# Patient Record
Sex: Male | Born: 1960 | Race: White | Hispanic: No | Marital: Single | Smoking: Never smoker
Health system: Southern US, Community
[De-identification: ages and names within clinical notes are randomized; demographics above are authoritative.]

## PROBLEM LIST (undated history)

## (undated) DIAGNOSIS — L570 Actinic keratosis: Secondary | ICD-10-CM

## (undated) DIAGNOSIS — G4730 Sleep apnea, unspecified: Secondary | ICD-10-CM

## (undated) HISTORY — DX: Actinic keratosis: L57.0

---

## 2006-06-30 ENCOUNTER — Ambulatory Visit: Payer: Self-pay | Admitting: Internal Medicine

## 2009-08-06 ENCOUNTER — Emergency Department: Payer: Self-pay | Admitting: Emergency Medicine

## 2013-09-16 ENCOUNTER — Ambulatory Visit: Payer: Self-pay | Admitting: Gastroenterology

## 2015-07-23 DIAGNOSIS — C4491 Basal cell carcinoma of skin, unspecified: Secondary | ICD-10-CM

## 2015-07-23 HISTORY — DX: Basal cell carcinoma of skin, unspecified: C44.91

## 2017-02-26 DIAGNOSIS — E1169 Type 2 diabetes mellitus with other specified complication: Secondary | ICD-10-CM | POA: Insufficient documentation

## 2017-02-26 DIAGNOSIS — E118 Type 2 diabetes mellitus with unspecified complications: Secondary | ICD-10-CM | POA: Insufficient documentation

## 2019-11-22 ENCOUNTER — Ambulatory Visit: Payer: PRIVATE HEALTH INSURANCE | Admitting: Dermatology

## 2019-11-22 ENCOUNTER — Other Ambulatory Visit: Payer: Self-pay

## 2019-11-22 DIAGNOSIS — L738 Other specified follicular disorders: Secondary | ICD-10-CM

## 2019-11-22 DIAGNOSIS — L578 Other skin changes due to chronic exposure to nonionizing radiation: Secondary | ICD-10-CM

## 2019-11-22 DIAGNOSIS — L570 Actinic keratosis: Secondary | ICD-10-CM | POA: Diagnosis not present

## 2019-11-22 DIAGNOSIS — L821 Other seborrheic keratosis: Secondary | ICD-10-CM

## 2019-11-22 DIAGNOSIS — Z85828 Personal history of other malignant neoplasm of skin: Secondary | ICD-10-CM | POA: Diagnosis not present

## 2019-11-22 NOTE — Progress Notes (Signed)
   Follow-Up Visit   Subjective  Michael Murillo is a 59 y.o. male who presents for the following: Follow-up (Hx of AKs on scalp, bil temples, R forearm.) and Scaly spots (arms).   The following portions of the chart were reviewed this encounter and updated as appropriate:     Review of Systems: No other skin or systemic complaints.  Objective  Well appearing patient in no apparent distress; mood and affect are within normal limits.  A focused examination was performed including face, arms, scalp. Relevant physical exam findings are noted in the Assessment and Plan.  Objective  Left Forearm x 4, Left Hand x 2, Right Hand x 3, Right Forearm x 1, Left Forehead x 2, Crown Scalp x 6, Left Temple x 1 (19): Pink brown scaly macules.  Objective  Left Forearm: Well healed scar with no evidence of recurrence.   Objective  Left Temple Scalp: Stuck-on, waxy brown plaque --Discussed benign etiology and prognosis.   Assessment & Plan   Actinic Damage - diffuse scaly erythematous macules with underlying dyspigmentation - Recommend daily broad spectrum sunscreen SPF 30+ to sun-exposed areas, reapply every 2 hours as needed.  - Call for new or changing lesions. Seborrheic Keratoses - Stuck-on, waxy, tan-brown papules and plaques  - Discussed benign etiology and prognosis. - Observe - Call for any changes Sebaceous Hyperplasia - Small yellow papules with a central dell - Benign - Observe          AK (actinic keratosis) (19) Left Forearm x 4, Left Hand x 2, Right Hand x 3, Right Forearm x 1, Left Forehead x 2, Crown Scalp x 6, Left Temple x 1  Destruction of lesion - Left Forearm x 4, Left Hand x 2, Right Hand x 3, Right Forearm x 1, Left Forehead x 2, Crown Scalp x 6, Left Temple x 1  Destruction method: cryotherapy   Informed consent: discussed and consent obtained   Lesion destroyed using liquid nitrogen: Yes   Region frozen until ice ball extended beyond lesion: Yes     Outcome: patient tolerated procedure well with no complications   Post-procedure details: wound care instructions given    History of basal cell carcinoma (BCC) Left Forearm  Clear. Observe for recurrence. Call clinic for new or changing lesions.  Recommend regular skin exams, daily broad-spectrum spf 30+ sunscreen use, and photoprotection.     Seborrheic keratosis Left Temple Scalp  Benign, observe.  Discussed LN2 if becomes bothersome, will take multiple treatments to clear.  Return in about 6 months (around 05/23/2020) for UBSE.   IJamesetta Orleans, CMA, am acting as scribe for Brendolyn Patty, MD .

## 2019-11-22 NOTE — Patient Instructions (Signed)
Cryotherapy Aftercare  . Wash gently with soap and water everyday.   . Apply Vaseline and Band-Aid daily until healed.  

## 2020-01-18 ENCOUNTER — Encounter: Payer: Self-pay | Admitting: Podiatry

## 2020-01-18 ENCOUNTER — Other Ambulatory Visit: Payer: Self-pay | Admitting: Podiatry

## 2020-01-18 ENCOUNTER — Ambulatory Visit: Payer: PRIVATE HEALTH INSURANCE | Admitting: Podiatry

## 2020-01-18 ENCOUNTER — Other Ambulatory Visit: Payer: Self-pay

## 2020-01-18 ENCOUNTER — Ambulatory Visit (INDEPENDENT_AMBULATORY_CARE_PROVIDER_SITE_OTHER): Payer: PRIVATE HEALTH INSURANCE

## 2020-01-18 DIAGNOSIS — M2022 Hallux rigidus, left foot: Secondary | ICD-10-CM | POA: Diagnosis not present

## 2020-01-18 DIAGNOSIS — M722 Plantar fascial fibromatosis: Secondary | ICD-10-CM

## 2020-01-18 DIAGNOSIS — L603 Nail dystrophy: Secondary | ICD-10-CM | POA: Diagnosis not present

## 2020-01-18 DIAGNOSIS — S86012A Strain of left Achilles tendon, initial encounter: Secondary | ICD-10-CM

## 2020-01-18 MED ORDER — MELOXICAM 15 MG PO TABS: 15.0000 mg | ORAL_TABLET | Freq: Every day | ORAL | 3 refills | Status: DC

## 2020-01-18 MED ORDER — TERBINAFINE HCL 250 MG PO TABS: 250.0000 mg | ORAL_TABLET | Freq: Every day | ORAL | 0 refills | Status: DC
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
## 2020-01-19 ENCOUNTER — Telehealth: Payer: Self-pay

## 2020-01-19 NOTE — Telephone Encounter (Signed)
Office notes have been faxed to Marissa Calamity with [employer] for review.  Fax [phone]

## 2020-01-20 ENCOUNTER — Telehealth: Payer: Self-pay

## 2020-01-20 DIAGNOSIS — S86012A Strain of left Achilles tendon, initial encounter: Secondary | ICD-10-CM

## 2020-01-20 NOTE — Telephone Encounter (Signed)
MRI approved from 01/19/20 to 04/18/20 Auth # [facility] Patient has been notified of approval and will contact scheduling to set up appt to his convenience.

## 2020-01-23 ENCOUNTER — Ambulatory Visit: Payer: Self-pay | Admitting: Podiatry

## 2020-02-05 ENCOUNTER — Ambulatory Visit
Admission: RE | Admit: 2020-02-05 | Discharge: 2020-02-05 | Disposition: A | Discharge status: Home / Self Care | Payer: PRIVATE HEALTH INSURANCE | Source: Ambulatory Visit | Attending: Podiatry | Admitting: Podiatry

## 2020-02-05 ENCOUNTER — Other Ambulatory Visit: Payer: Self-pay

## 2020-02-05 DIAGNOSIS — S86012A Strain of left Achilles tendon, initial encounter: Secondary | ICD-10-CM | POA: Insufficient documentation

## 2020-02-07 ENCOUNTER — Telehealth: Payer: Self-pay | Admitting: *Deleted

## 2020-02-07 NOTE — Telephone Encounter (Signed)
I informed pt of Dr. Stephenie Acres request to send a copy of the MRI disc to a radiology specialist for more details for treatment planning and there would be a 10 -14 day delay in final results and once received we would call with instructions. Faxed request for MRI disc to [facility].

## 2020-02-07 NOTE — Telephone Encounter (Signed)
-----   Message from Garrel Ridgel, [place] sent at 02/07/2020  7:40 AM EDT ----- Val please send for over read and inform patient of the delay.  I highly suspect an interstitial tear and would like another opinion.  Thanks

## 2020-02-16 NOTE — Telephone Encounter (Signed)
Received copy of MRI disc and mailed to [facility]. 

## 2020-03-12 ENCOUNTER — Encounter: Payer: Self-pay | Admitting: Podiatry

## 2020-03-12 ENCOUNTER — Other Ambulatory Visit: Payer: Self-pay

## 2020-03-12 ENCOUNTER — Ambulatory Visit: Payer: PRIVATE HEALTH INSURANCE | Admitting: Podiatry

## 2020-03-12 DIAGNOSIS — S86012D Strain of left Achilles tendon, subsequent encounter: Secondary | ICD-10-CM | POA: Diagnosis not present

## 2020-03-12 NOTE — Progress Notes (Signed)
He presents today for MRI results regarding his left heel.  He denies any changes in his past medical history medications allergies surgeries and social history.  He states he continues to take his Lamisil without any problems.  Continues to take the Mobic as needed for pain.  Objective: Vital signs are stable he is alert oriented x3 MRI does demonstrate partial thickness interstitial tearing at the Achilles insertion site.  Retrocalcaneal bursitis is also noted.  Assessment: Tears of the Achilles tendinous insertion site.  Onychomycosis.  Plan: Continue current medications.  We did discuss in great detail today what he would take to surgically repair this he understands and is amenable to it he would like to follow-up with his family and discuss this and possibly a second opinion.

## 2020-03-19 ENCOUNTER — Telehealth: Payer: Self-pay | Admitting: *Deleted

## 2020-03-19 ENCOUNTER — Telehealth: Payer: Self-pay

## 2020-03-19 NOTE — Telephone Encounter (Signed)
Patient called today and stated that he would like to go ahead with surgery.  Do you want him to come back in for office visit or can he come by to pick up surgical packet and sign forms since surgery has already been discussed.   Please advise

## 2020-03-19 NOTE — Telephone Encounter (Signed)
"  I was calling to schedule surgery on my Achilles Tendon.  I been seeing Dr. Milinda Pointer.  Do I need to talk back with him or you all? Give me a call."

## 2020-03-20 NOTE — Telephone Encounter (Signed)
No he must come back because the note was not written as a consult and he was considering another opinion.

## 2020-03-20 NOTE — Telephone Encounter (Signed)
I spoke with patient and informed him that he would need to come back in for surgical consult with Dr. Milinda Pointer.  He stated that he will call back to get scheduled

## 2020-04-11 ENCOUNTER — Ambulatory Visit: Payer: PRIVATE HEALTH INSURANCE | Admitting: Podiatry

## 2020-04-11 ENCOUNTER — Encounter: Payer: Self-pay | Admitting: Podiatry

## 2020-04-11 ENCOUNTER — Other Ambulatory Visit: Payer: Self-pay

## 2020-04-11 DIAGNOSIS — S86012D Strain of left Achilles tendon, subsequent encounter: Secondary | ICD-10-CM | POA: Diagnosis not present

## 2020-04-11 NOTE — Progress Notes (Signed)
He presents today to discuss surgery on his left foot.  We had discussed it previously as her MRI come back positive for a tear of the Achilles tendon.  Objective: Vital signs are stable alert oriented x3.  Pulses are palpable.  He still has severe pain on palpation of the posterior aspect of the calcaneus he also has pain on palpation of the trunk and attempted range of motion of the first metatarsophalangeal joint.  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  Plan: Discussed etiology pathology conservative versus surgical therapies.  At this point we consented him today for a gastroc recession Achilles tenolysis retrocalcaneal heel spur resection a Keller arthroplasty with a single silicone implant and a cast.  Answered all questions regarding these procedures to the best of my ability layman's terms he understands that he will be out of work for no less than at least 4 weeks usually 2 months with this.  We did discuss the possible side effects and complications which may include but not limited to postop pain bleeding swelling infection recurrence need for further surgery overcorrection under correction loss of digit loss of limb loss of life.  I will follow-up with him in the near future for surgical intervention should he have questions or concerns he will notify us immediately.

## 2020-04-25 ENCOUNTER — Telehealth: Payer: Self-pay | Admitting: *Deleted

## 2020-04-25 NOTE — Telephone Encounter (Signed)
I'm calling to let you know we have not received any FMLA forms from [facility].  The only thing we have is the form that you brought by giving [place] permission to release your information.  "They should have sent you something by now.  I'll give them a call and follow-up."  Great, I just wanted to give you a heads up because I know you're scheduled for surgery soon.  "Thank you so much, I appreciate it."

## 2020-04-26 ENCOUNTER — Telehealth: Payer: Self-pay

## 2020-04-26 NOTE — Telephone Encounter (Signed)
DOS 09/24/021  KELLER BUNION IMPLANT LT - [id number] TENOLYSIS LT - [id number] GASTROCNEMIUS RECESS LT - [id number] CALCANEAL OSTECTOMY LT - 24097  [employer] EFFECTIVE DATE - 08/11/2014  PLAN DEDUCTIBLE - $2000.00 W/ $2000.00 MET OUT OF POCKET - $4500.00 W/ $2503.77 MET COPAY $0.00 COINSURANCE - 80%  SPOKE TO [employer] FOR BENEFITS CALL REF # [id number]  SPOKE TO ROBIN A AT [employer] FOR AUTHORIZATION CALL REF # [id number] A 04/26/2020. ROBIN STATED NO AUTH REQUIRED FOR CPT [id number], [id number] OR [id number].

## 2020-05-03 ENCOUNTER — Other Ambulatory Visit: Payer: Self-pay | Admitting: Podiatry

## 2020-05-03 MED ORDER — OXYCODONE-ACETAMINOPHEN 10-325 MG PO TABS: 1.0000 | ORAL_TABLET | Freq: Three times a day (TID) | ORAL | 0 refills | Status: AC | PRN

## 2020-05-03 MED ORDER — ONDANSETRON HCL 4 MG PO TABS: 4.0000 mg | ORAL_TABLET | Freq: Three times a day (TID) | ORAL | 0 refills | Status: DC | PRN

## 2020-05-03 MED ORDER — CEPHALEXIN 500 MG PO CAPS
500.0000 mg | ORAL_CAPSULE | Freq: Three times a day (TID) | ORAL | 0 refills | Status: DC
Start: 2020-05-03 — End: 2020-06-18

## 2020-05-04 DIAGNOSIS — M216X2 Other acquired deformities of left foot: Secondary | ICD-10-CM | POA: Diagnosis not present

## 2020-05-04 DIAGNOSIS — M7732 Calcaneal spur, left foot: Secondary | ICD-10-CM

## 2020-05-04 DIAGNOSIS — M2022 Hallux rigidus, left foot: Secondary | ICD-10-CM | POA: Diagnosis not present

## 2020-05-04 DIAGNOSIS — S86012D Strain of left Achilles tendon, subsequent encounter: Secondary | ICD-10-CM | POA: Diagnosis not present

## 2020-05-04 NOTE — Telephone Encounter (Signed)
An appointment was scheduled for 04/11/2020 with Dr. Milinda Pointer.

## 2020-05-09 ENCOUNTER — Other Ambulatory Visit: Payer: Self-pay

## 2020-05-09 ENCOUNTER — Ambulatory Visit (INDEPENDENT_AMBULATORY_CARE_PROVIDER_SITE_OTHER): Payer: PRIVATE HEALTH INSURANCE

## 2020-05-09 ENCOUNTER — Ambulatory Visit (INDEPENDENT_AMBULATORY_CARE_PROVIDER_SITE_OTHER): Payer: PRIVATE HEALTH INSURANCE | Admitting: Podiatry

## 2020-05-09 ENCOUNTER — Encounter: Payer: Self-pay | Admitting: Podiatry

## 2020-05-09 VITALS — BP 143/101 | HR 84 | Temp 99.1°F

## 2020-05-09 DIAGNOSIS — S86012D Strain of left Achilles tendon, subsequent encounter: Secondary | ICD-10-CM | POA: Diagnosis not present

## 2020-05-09 DIAGNOSIS — Z9889 Other specified postprocedural states: Secondary | ICD-10-CM

## 2020-05-09 NOTE — Progress Notes (Signed)
He presents today for his first postop visit date of surgery 05/04/2020 retrocalcaneal heel spur resection gastroc recession Achilles tenolysis and arthroplasty of the first metatarsophalangeal joint.  He denies fever chills nausea vomiting muscle aches pains calf pain back pain chest pain shortness of breath.  Objective: Cast is intact appears to be clean on the bottom using a wheelchair at this point he has good motion of the toe.  Radiographs demonstrate a Keller arthroplasty single silicone implant in good position as well as a retrocalcaneal heel spur resection in good position.  Assessment: Well-healing surgical foot.  Plan: Have him back in 1 week for cast exchange.  Follow-up with me in 1 week call sooner if needed.

## 2020-05-14 ENCOUNTER — Other Ambulatory Visit: Payer: Self-pay | Admitting: Podiatry

## 2020-05-16 ENCOUNTER — Ambulatory Visit (INDEPENDENT_AMBULATORY_CARE_PROVIDER_SITE_OTHER): Payer: PRIVATE HEALTH INSURANCE | Admitting: Podiatry

## 2020-05-16 ENCOUNTER — Other Ambulatory Visit: Payer: Self-pay

## 2020-05-16 DIAGNOSIS — S86012D Strain of left Achilles tendon, subsequent encounter: Secondary | ICD-10-CM | POA: Diagnosis not present

## 2020-05-16 DIAGNOSIS — Z9889 Other specified postprocedural states: Secondary | ICD-10-CM

## 2020-05-16 NOTE — Progress Notes (Signed)
He presents today for his second postop visit he is status post gastroc recession retrocalcaneal heel spur resection Achilles tenolysis and cast application left foot.  He denies fever chills nausea vomiting muscle aches pains calf pain back pain chest pain shortness of breath.  Presents today with cast intact dry and clean.  Ejective: Vital signs are stable alert oriented x3 presents nonweightbearing utilizing a knee scooter cast is dry and clean.  I once removed demonstrates dressing intact was removed demonstrates sutures and staples are intact there is no edema minimal erythema no cellulitis drainage or odor.  He has great range of motion of the first metatarsophalangeal joint actively and passively.  He has good plantar flexion against resistance.    Assessment: Well-healing surgical foot leg left x2 weeks.  Plan: Redressed today with dressed a compressive dressing and placed him in another below-the-knee cast he will continue nonweightbearing status for 2 weeks the cast with him to be removed and he will plate placed in a cam walker after all the staples are removed if possible.  He will be nonweightbearing for another 2 weeks at that point

## 2020-05-30 ENCOUNTER — Encounter: Payer: Self-pay | Admitting: Podiatry

## 2020-05-30 ENCOUNTER — Other Ambulatory Visit: Payer: Self-pay

## 2020-05-30 ENCOUNTER — Ambulatory Visit (INDEPENDENT_AMBULATORY_CARE_PROVIDER_SITE_OTHER): Payer: PRIVATE HEALTH INSURANCE | Admitting: Podiatry

## 2020-05-30 DIAGNOSIS — S86012D Strain of left Achilles tendon, subsequent encounter: Secondary | ICD-10-CM | POA: Diagnosis not present

## 2020-05-30 DIAGNOSIS — Z9889 Other specified postprocedural states: Secondary | ICD-10-CM | POA: Diagnosis not present

## 2020-05-30 NOTE — Progress Notes (Signed)
  Subjective:  Patient ID: Michael Murillo, male    DOB: Dec 06, 1960,  MRN: [id number]  Chief Complaint  Patient presents with  . Routine Post Op    POV #3 DOS 05/04/20 ACHILLES TENOLYSIS, HEEL SPUR RECESTION, GASTRO RECESSION, KELLER ARTHROPLASTY W/SILICONE IMPLANT AND CAST APPLICATION LT FOOT  . Suture / Staple Removal    Staples removed today    DOS: 05/04/2020 Procedure: Left Achilles heel spur resection, Achilles debridement, gastrocnemius recession, and Keller arthroplasty with silicone implant  59 y.o. male returns for post-op check.  Doing well.  He has been NWB in a BK cast  Review of Systems: Negative except as noted in the HPI. Denies N/V/F/Ch.   Objective:  There were no vitals filed for this visit. There is no height or weight on file to calculate BMI. Constitutional Well developed. Well nourished.  Vascular Foot warm and well perfused. Capillary refill normal to all digits.   Neurologic Normal speech. Oriented to person, place, and time. Epicritic sensation to light touch grossly present bilaterally.  Dermatologic Skin healed well without signs of infection.  Non- hypertrophic scar.  Skin edges well coapted without signs of infection.  Orthopedic: Tenderness to palpation noted about the surgical site.     Assessment:   1. Rupture of left Achilles tendon, subsequent encounter   2. S/P foot surgery, left    Plan:  Patient was evaluated and treated and all questions answered.  S/p foot surgery left -Progressing as expected post-operatively. -WB Status: NWB in CAM boot with crutches -Sutures: All removed today as well as staples.  Fresh Steri-Strips were applied. -Begin range of motion exercises of the hallux -Return to see Dr. Milinda Pointer in 2 weeks for reevaluation, hopeful transition to partial weightbearing in CAM boot -Advised he may gently bathe his incisions, but should not soak them  Return in about 2 weeks (around 06/13/2020).

## 2020-06-04 ENCOUNTER — Encounter: Payer: PRIVATE HEALTH INSURANCE | Admitting: Podiatry

## 2020-06-05 ENCOUNTER — Ambulatory Visit: Payer: PRIVATE HEALTH INSURANCE | Admitting: Dermatology

## 2020-06-05 ENCOUNTER — Other Ambulatory Visit: Payer: Self-pay

## 2020-06-05 DIAGNOSIS — L814 Other melanin hyperpigmentation: Secondary | ICD-10-CM

## 2020-06-05 DIAGNOSIS — L578 Other skin changes due to chronic exposure to nonionizing radiation: Secondary | ICD-10-CM

## 2020-06-05 DIAGNOSIS — B351 Tinea unguium: Secondary | ICD-10-CM | POA: Diagnosis not present

## 2020-06-05 DIAGNOSIS — Z1283 Encounter for screening for malignant neoplasm of skin: Secondary | ICD-10-CM

## 2020-06-05 DIAGNOSIS — L821 Other seborrheic keratosis: Secondary | ICD-10-CM

## 2020-06-05 DIAGNOSIS — L739 Follicular disorder, unspecified: Secondary | ICD-10-CM | POA: Diagnosis not present

## 2020-06-05 DIAGNOSIS — L570 Actinic keratosis: Secondary | ICD-10-CM | POA: Diagnosis not present

## 2020-06-05 DIAGNOSIS — Z85828 Personal history of other malignant neoplasm of skin: Secondary | ICD-10-CM

## 2020-06-05 DIAGNOSIS — L918 Other hypertrophic disorders of the skin: Secondary | ICD-10-CM

## 2020-06-05 DIAGNOSIS — D229 Melanocytic nevi, unspecified: Secondary | ICD-10-CM

## 2020-06-05 DIAGNOSIS — D2339 Other benign neoplasm of skin of other parts of face: Secondary | ICD-10-CM

## 2020-06-05 DIAGNOSIS — D1800 Hemangioma unspecified site: Secondary | ICD-10-CM

## 2020-06-05 MED ORDER — CICLOPIROX OLAMINE 0.77 % EX CREA: TOPICAL_CREAM | Freq: Two times a day (BID) | CUTANEOUS | 5 refills | Status: DC

## 2020-06-05 NOTE — Patient Instructions (Signed)

## 2020-06-05 NOTE — Progress Notes (Signed)
Follow-Up Visit   Subjective  Michael Murillo is a 59 y.o. male who presents for the following: Annual Exam (UBSE - hx of BCC and AK's ) and lesions (around the neck - irritated, patient would like them removed).  He has had a h/o tinea unguium and was treated about 2 years ago with Lamisil and ciclopirox cream.  The toenails cleared up, but the fungus is coming back.  He just has major foot surgery on the L foot, so he wants to wait until that is healed before restarting treatment.  The following portions of the chart were reviewed this encounter and updated as appropriate:     Review of Systems:  No other skin or systemic complaints except as noted in HPI or Assessment and Plan.  Objective  Well appearing patient in no apparent distress; mood and affect are within normal limits.  All skin waist up examined.  Objective  Right Foot - toenails: Right great toe, 2nd, 4th and 5th with yellow brown discoloration and subungual debris.  L foot in boot from surgery  Objective  Back, abdomen: Pink follicular papules  Objective  Scalp x 8, R temple x 1, R cheek x 2, L sideburn x 1 (12): Erythematous thin papules/macules with gritty scale.   Objective  Right malar cheek: 66mm pink flesh papule  Assessment & Plan  Onychomycosis Right Foot - toenails  With recurrence Restart ciclopirox cream twice daily to feet and in between toes.   Consider repeat course of tx with terbinafine once left foot healed from recent surgery   Ordered Medications: ciclopirox (LOPROX) 1.61 % cream  Folliculitis Back, abdomen  Mild Recommend over the counter BP wash daily in shower  Benzoyl peroxide can cause dryness and irritation of the skin. It can also bleach fabric.   AK (actinic keratosis) (12) Scalp x 8, R temple x 1, R cheek x 2, L sideburn x 1  Recommend field treatment Discussed tx with PDT in detail to scalp x 2 scheduled 1 month apart.  Avoid sun exposure for 2 days after  procedure Pt will schedule   Destruction of lesion - Scalp x 8, R temple x 1, R cheek x 2, L sideburn x 1 Complexity: simple   Destruction method: cryotherapy   Informed consent: discussed and consent obtained   Lesion destroyed using liquid nitrogen: Yes   Region frozen until ice ball extended beyond lesion: Yes   Outcome: patient tolerated procedure well with no complications   Post-procedure details: wound care instructions given    Fibrous papule of cheek Right malar cheek  Vs sebaceous hyperplasia  Benign-appearing.  Observation.  Call clinic for new or changing lesions.  Recommend daily use of broad spectrum spf 30+ sunscreen to sun-exposed areas.     Lentigines - Scattered tan macules - Discussed due to sun exposure - Benign, observe - Call for any changes  Seborrheic Keratoses - Stuck-on, waxy, tan-brown papules and plaques, 3.0cm plaque at left temple scalp - Discussed benign etiology and prognosis. - Observe - Call for any changes  Melanocytic Nevi - Tan-brown and/or pink-flesh-colored symmetric macules and papules left malar cheek - Benign appearing on exam today - Observation - Call clinic for new or changing moles - Recommend daily use of broad spectrum spf 30+ sunscreen to sun-exposed areas.   Hemangiomas - Red papules - Discussed benign nature - Observe - Call for any changes  Actinic Damage - diffuse scaly erythematous macules with underlying dyspigmentation scalp - Discussed tx  with PDT to scalp x 2 scheduled 1 month apart. - Recommend daily broad spectrum sunscreen SPF 30+ to sun-exposed areas, reapply every 2 hours as needed.  - Call for new or changing lesions.  History of Basal Cell Carcinoma of the Skin - No evidence of recurrence today - Recommend regular full body skin exams - Recommend daily broad spectrum sunscreen SPF 30+ to sun-exposed areas, reapply every 2 hours as needed.  - Call if any new or changing lesions are noted between  office visits  Acrochordons (Skin Tags) - Fleshy, skin-colored pedunculated papules neck - Benign appearing.  - Observe. - If desired, they can be removed with an in office procedure that is not covered by insurance. - Please call the clinic if you notice any new or changing lesions.  Skin cancer screening performed today.   Return for PDT to scalp x 2 scheduled one month apart, 6 month AK f/up.   Luther Redo, CMA, am acting as scribe for Brendolyn Patty, MD .  Documentation: I have reviewed the above documentation for accuracy and completeness, and I agree with the above.  Brendolyn Patty MD

## 2020-06-11 ENCOUNTER — Telehealth: Payer: Self-pay

## 2020-06-11 ENCOUNTER — Ambulatory Visit (INDEPENDENT_AMBULATORY_CARE_PROVIDER_SITE_OTHER): Payer: PRIVATE HEALTH INSURANCE

## 2020-06-11 ENCOUNTER — Ambulatory Visit: Payer: PRIVATE HEALTH INSURANCE

## 2020-06-11 ENCOUNTER — Other Ambulatory Visit: Payer: Self-pay

## 2020-06-11 DIAGNOSIS — L570 Actinic keratosis: Secondary | ICD-10-CM

## 2020-06-11 MED ORDER — AMINOLEVULINIC ACID HCL 20 % EX SOLR
1.0000 "application " | Freq: Once | CUTANEOUS | Status: AC
  Administered 2020-06-11: 354 mg via TOPICAL

## 2020-06-11 NOTE — Patient Instructions (Signed)

## 2020-06-11 NOTE — Progress Notes (Signed)
Patient completed PDT therapy today.  1. AK (actinic keratosis) Scalp  Photodynamic therapy - Scalp Procedure discussed: discussed risks, benefits, side effects. and alternatives   Prep: site scrubbed/prepped with acetone   Location:  Scalp Number of lesions:  Multiple Type of treatment:  Blue light Aminolevulinic Acid (see MAR for details): Levulan Number of Levulan sticks used:  1 Incubation time (minutes):  120 Number of minutes under lamp:  16 Number of seconds under lamp:  40 Cooling:  Floor fan Outcome: patient tolerated procedure well with no complications   Post-procedure details: sunscreen applied    Aminolevulinic Acid HCl 20 % SOLR 354 mg - Scalp    

## 2020-06-11 NOTE — Telephone Encounter (Signed)
-----   Message from Brendolyn Patty, MD sent at 06/05/2020  9:45 PM EDT ----- It looks like he wanted the skin tags removed around his neck.  I didn't really talk about that with him during the visit.  Could you call him and let him know that we can do a skin tag removal, $115 noncovered. And he can schedule sooner than 6 months for that.  He can also call his insurance first and see if they cover skin tag removal, and if so he would sign a form and we can submit for insurance.  Thanks

## 2020-06-11 NOTE — Telephone Encounter (Signed)
Unable to leave a message voicemail box full.  

## 2020-06-12 NOTE — Telephone Encounter (Signed)
Discussed with patient skin tag removal and cost. He states that he will think about it and let us know if he decides to schedule it.

## 2020-06-18 ENCOUNTER — Ambulatory Visit (INDEPENDENT_AMBULATORY_CARE_PROVIDER_SITE_OTHER): Payer: PRIVATE HEALTH INSURANCE | Admitting: Podiatry

## 2020-06-18 ENCOUNTER — Other Ambulatory Visit: Payer: Self-pay

## 2020-06-18 ENCOUNTER — Encounter: Payer: Self-pay | Admitting: Podiatry

## 2020-06-18 DIAGNOSIS — Z9889 Other specified postprocedural states: Secondary | ICD-10-CM

## 2020-06-18 DIAGNOSIS — S86012D Strain of left Achilles tendon, subsequent encounter: Secondary | ICD-10-CM

## 2020-06-18 NOTE — Progress Notes (Signed)
He presents today date of surgery 05/04/2020 status post Achilles tendon repair with retrocalcaneal heel spur resection gastroc recession and a Keller arthroplasty with a single silicone implant.  He states that he is doing fine no pain whatsoever continues to wash the leg but continues to be nonweightbearing with his cam walker.  Objective: Vital signs are stable he is alert and oriented x3.  Pulses are palpable.  There is no erythema edema cellulitis drainage or odor Steri-Strips intact once removed demonstrates margins are well intact and no dehiscence.  He has good plantar flexion against resistance his hallux is slightly plantarflexed but most likely due to the lack of use of the his extensor hallucis longus over the past 20 years.  He does have some motion in the first metatarsophalangeal joint as far as dorsiflexion goes but better plantar flexion.  He has improved since I saw him last.  He has no pain on palpation of the posterior leg and calcaneus.  Assessment: Well-healing surgical foot left.  Plan: I am going to encouraged him to start walking with his cam walker first initially with crutches and then proceed to full ambulation full weightbearing.  He understands this is amendable to it I will follow-up with him in 2 weeks at which time we have to get into regular shoe gear.

## 2020-06-21 DIAGNOSIS — M79676 Pain in unspecified toe(s): Secondary | ICD-10-CM

## 2020-07-02 ENCOUNTER — Other Ambulatory Visit: Payer: Self-pay

## 2020-07-02 ENCOUNTER — Encounter: Payer: Self-pay | Admitting: Podiatry

## 2020-07-02 ENCOUNTER — Ambulatory Visit (INDEPENDENT_AMBULATORY_CARE_PROVIDER_SITE_OTHER): Payer: PRIVATE HEALTH INSURANCE | Admitting: Podiatry

## 2020-07-02 DIAGNOSIS — Z9889 Other specified postprocedural states: Secondary | ICD-10-CM

## 2020-07-02 DIAGNOSIS — S86012D Strain of left Achilles tendon, subsequent encounter: Secondary | ICD-10-CM

## 2020-07-02 NOTE — Progress Notes (Signed)
Mr. Gruenewald presents today date of surgery 05/04/2020 Achilles tenolysis heel spur resection gastroc recession Keller arthroplasty single silicone implant he states that doing good still swells some by the end of the day and turns red and I continue to wear a cam boot with crutches.  Objective: Vital signs are stable he is alert and oriented x3 presents with his crutches and his cam boot today partially walking partial weightbearing.  Once the cast walker was removed minimal edema no erythema cellulitis drainage or odor incision site is going to heal uneventfully has great range of motion passively and actively of the ankle joint as well as the first metatarsophalangeal joint.  Assessment: Well-healing surgical foot.  Plan: I want him to discontinue the use of the crutches and over the next 2 weeks discontinue the use of the cam walker back into a pair of tennis shoes.  Encourage range of motion exercises and massage therapy he understands this is amendable to it.  I would like to soft follow-up with him December 22 prior to him going back to work on the 24th.

## 2020-07-26 ENCOUNTER — Other Ambulatory Visit
Admission: RE | Admit: 2020-07-26 | Discharge: 2020-07-26 | Disposition: A | Discharge status: Home / Self Care | Payer: PRIVATE HEALTH INSURANCE | Source: Ambulatory Visit | Attending: Internal Medicine | Admitting: Internal Medicine

## 2020-07-26 ENCOUNTER — Other Ambulatory Visit: Payer: Self-pay

## 2020-07-26 DIAGNOSIS — Z20822 Contact with and (suspected) exposure to covid-19: Secondary | ICD-10-CM | POA: Diagnosis not present

## 2020-07-26 DIAGNOSIS — Z01812 Encounter for preprocedural laboratory examination: Secondary | ICD-10-CM | POA: Diagnosis present

## 2020-07-26 LAB — SARS CORONAVIRUS 2 (TAT 6-24 HRS): SARS Coronavirus 2: NEGATIVE

## 2020-07-27 ENCOUNTER — Encounter: Payer: Self-pay | Admitting: Internal Medicine

## 2020-07-30 ENCOUNTER — Encounter: Payer: Self-pay | Admitting: Internal Medicine

## 2020-07-30 ENCOUNTER — Ambulatory Visit: Payer: PRIVATE HEALTH INSURANCE | Admitting: Certified Registered"

## 2020-07-30 ENCOUNTER — Ambulatory Visit
Admission: RE | Admit: 2020-07-30 | Discharge: 2020-07-30 | Disposition: A | Discharge status: Home / Self Care | Payer: PRIVATE HEALTH INSURANCE | Attending: Internal Medicine | Admitting: Internal Medicine

## 2020-07-30 ENCOUNTER — Encounter
Admission: RE | Disposition: A | Discharge status: Home / Self Care | Payer: Self-pay | Source: Home / Self Care | Attending: Internal Medicine

## 2020-07-30 ENCOUNTER — Other Ambulatory Visit: Payer: Self-pay

## 2020-07-30 DIAGNOSIS — Q438 Other specified congenital malformations of intestine: Secondary | ICD-10-CM | POA: Insufficient documentation

## 2020-07-30 DIAGNOSIS — Z1211 Encounter for screening for malignant neoplasm of colon: Secondary | ICD-10-CM | POA: Insufficient documentation

## 2020-07-30 DIAGNOSIS — Z888 Allergy status to other drugs, medicaments and biological substances status: Secondary | ICD-10-CM | POA: Insufficient documentation

## 2020-07-30 DIAGNOSIS — Z79899 Other long term (current) drug therapy: Secondary | ICD-10-CM | POA: Insufficient documentation

## 2020-07-30 DIAGNOSIS — K640 First degree hemorrhoids: Secondary | ICD-10-CM | POA: Insufficient documentation

## 2020-07-30 DIAGNOSIS — K635 Polyp of colon: Secondary | ICD-10-CM | POA: Insufficient documentation

## 2020-07-30 DIAGNOSIS — Z800 Family history of malignant neoplasm of digestive organs: Secondary | ICD-10-CM | POA: Diagnosis not present

## 2020-07-30 DIAGNOSIS — Z8371 Family history of colonic polyps: Secondary | ICD-10-CM | POA: Diagnosis not present

## 2020-07-30 DIAGNOSIS — Z85828 Personal history of other malignant neoplasm of skin: Secondary | ICD-10-CM | POA: Diagnosis not present

## 2020-07-30 DIAGNOSIS — Z791 Long term (current) use of non-steroidal anti-inflammatories (NSAID): Secondary | ICD-10-CM | POA: Diagnosis not present

## 2020-07-30 HISTORY — PX: COLONOSCOPY WITH PROPOFOL: SHX5780

## 2020-07-30 HISTORY — DX: Sleep apnea, unspecified: G47.30

## 2020-07-30 SURGERY — COLONOSCOPY WITH PROPOFOL
Anesthesia: General

## 2020-07-30 MED ORDER — PROPOFOL 500 MG/50ML IV EMUL
INTRAVENOUS | Status: DC | PRN
  Administered 2020-07-30: 130 ug/kg/min via INTRAVENOUS

## 2020-07-30 MED ORDER — PROPOFOL 10 MG/ML IV BOLUS
INTRAVENOUS | Status: DC | PRN
  Administered 2020-07-30: 80 mg via INTRAVENOUS

## 2020-07-30 MED ORDER — SODIUM CHLORIDE 0.9 % IV SOLN: INTRAVENOUS | Status: DC

## 2020-07-30 NOTE — Anesthesia Preprocedure Evaluation (Signed)
Anesthesia Evaluation  Patient identified by MRN, date of birth, ID band Patient awake    Reviewed: Allergy & Precautions, H&P , NPO status , Patient's Chart, lab work & pertinent test results  History of Anesthesia Complications Negative for: history of anesthetic complications  Airway Mallampati: III  TM Distance: >3 FB     Dental  (+) Chipped   Pulmonary sleep apnea , neg COPD,    breath sounds clear to auscultation       Cardiovascular (-) angina(-) Past MI and (-) Cardiac Stents negative cardio ROS  (-) dysrhythmias  Rhythm:regular Rate:Normal     Neuro/Psych negative neurological ROS  negative psych ROS   GI/Hepatic negative GI ROS, Neg liver ROS,   Endo/Other  diabetes  Renal/GU negative Renal ROS  negative genitourinary   Musculoskeletal   Abdominal   Peds  Hematology negative hematology ROS (+)   Anesthesia Other Findings Past Medical History: No date: Actinic keratosis 07/23/2015: Basal cell carcinoma     Comment:  Right upper back. Nodular pattern. EDC 07/23/2015: Basal cell carcinoma     Comment:  Left forearm. Nodular pattern. Excision: 08/20/2015 No date: Sleep apnea  History reviewed. No pertinent surgical history.  BMI    Body Mass Index: 30.99 kg/m      Reproductive/Obstetrics negative OB ROS                             Anesthesia Physical Anesthesia Plan  ASA: II  Anesthesia Plan: General   Post-op Pain Management:    Induction:   PONV Risk Score and Plan: Propofol infusion and TIVA  Airway Management Planned: Nasal Cannula  Additional Equipment:   Intra-op Plan:   Post-operative Plan:   Informed Consent: I have reviewed the patients History and Physical, chart, labs and discussed the procedure including the risks, benefits and alternatives for the proposed anesthesia with the patient or authorized representative who has indicated his/her  understanding and acceptance.     Dental Advisory Given  Plan Discussed with: Anesthesiologist, CRNA and Surgeon  Anesthesia Plan Comments:         Anesthesia Quick Evaluation

## 2020-07-30 NOTE — Op Note (Signed)
[facility] Gastroenterology Patient Name: Michael Murillo Procedure Date: 07/30/2020 1:58 PM MRN: [id number] Account #: [id number] Date of Birth: 09-25-1960 Admit Type: Outpatient Age: 59 Room: [facility] ENDO ROOM 2 Gender: Male Note Status: Finalized Procedure:             Colonoscopy Indications:           Colon cancer screening in patient at increased risk:                         Family history of 1st-degree relative with colon polyps Providers:             Benay Pike. Davetta Olliff MD, MD Medicines:             Propofol per Anesthesia Complications:         No immediate complications. Procedure:             Pre-Anesthesia Assessment:                        - The risks and benefits of the procedure and the                         sedation options and risks were discussed with the                         patient. All questions were answered and informed                         consent was obtained.                        - Patient identification and proposed procedure were                         verified prior to the procedure by the nurse. The                         procedure was verified in the procedure room.                        - ASA Grade Assessment: III - A patient with severe                         systemic disease.                        - After reviewing the risks and benefits, the patient                         was deemed in satisfactory condition to undergo the                         procedure.                        After obtaining informed consent, the colonoscope was                         passed under direct vision. Throughout the procedure,  the patient's blood pressure, pulse, and oxygen                         saturations were monitored continuously. The                         Colonoscope was introduced through the anus and                         advanced to the the cecum, identified by appendiceal                          orifice and ileocecal valve. The colonoscopy was                         somewhat difficult due to a redundant colon.                         Successful completion of the procedure was aided by                         applying abdominal pressure. The patient tolerated the                         procedure well. The quality of the bowel preparation                         was adequate. The ileocecal valve, appendiceal                         orifice, and rectum were photographed. Findings:      The perianal and digital rectal examinations were normal. Pertinent       negatives include normal sphincter tone and no palpable rectal lesions.      Non-bleeding internal hemorrhoids were found during retroflexion. The       hemorrhoids were Grade I (internal hemorrhoids that do not prolapse).      A 6 mm polyp was found in the transverse colon. The polyp was sessile.       The polyp was removed with a jumbo cold forceps. Resection and retrieval       were complete.      The exam was otherwise without abnormality. Impression:            - Non-bleeding internal hemorrhoids.                        - One 6 mm polyp in the transverse colon, removed with                         a jumbo cold forceps. Resected and retrieved.                        - The examination was otherwise normal. Recommendation:        - Patient has a contact number available for                         emergencies. The signs and symptoms of potential  delayed complications were discussed with the patient.                         Return to normal activities tomorrow. Written                         discharge instructions were provided to the patient.                        - Resume previous diet.                        - Continue present medications.                        - Repeat colonoscopy is recommended for surveillance.                         The colonoscopy date will be determined after                          pathology results from today's exam become available                         for review.                        - Return to GI office PRN.                        - The findings and recommendations were discussed with                         the patient. Procedure Code(s):     --- Professional ---                        [phone], Colonoscopy, flexible; with biopsy, single or                         multiple Diagnosis Code(s):     --- Professional ---                        K64.0, First degree hemorrhoids                        K63.5, Polyp of colon                        Z83.71, Family history of colonic polyps CPT copyright 2019 [employer] Association. All rights reserved. The codes documented in this report are preliminary and upon coder review may  be revised to meet current compliance requirements. Efrain Sella MD, MD 07/30/2020 2:40:02 PM This report has been signed electronically. Number of Addenda: 0 Note Initiated On: 07/30/2020 1:58 PM Scope Withdrawal Time: 0 hours 6 minutes 49 seconds  Total Procedure Duration: 0 hours 16 minutes 52 seconds  Estimated Blood Loss:  Estimated blood loss: none.      [facility]

## 2020-07-30 NOTE — Transfer of Care (Signed)
Immediate Anesthesia Transfer of Care Note  Patient: Michael Murillo  Procedure(s) Performed: COLONOSCOPY WITH PROPOFOL (N/A )  Patient Location: PACU and Endoscopy Unit  Anesthesia Type:General  Level of Consciousness: drowsy  Airway & Oxygen Therapy: Patient connected to nasal cannula oxygen  Post-op Assessment: Report given to RN  Post vital signs: stable  Last Vitals:  Vitals Value Taken Time  BP    Temp    Pulse    Resp    SpO2      Last Pain:  Vitals:   07/30/20 1258  TempSrc: Temporal  PainSc: 0-No pain         Complications: No complications documented.

## 2020-07-30 NOTE — Anesthesia Postprocedure Evaluation (Signed)
Anesthesia Post Note  Patient: Michael Murillo  Procedure(s) Performed: COLONOSCOPY WITH PROPOFOL (N/A )  Patient location during evaluation: Endoscopy Anesthesia Type: General Level of consciousness: awake and alert and oriented Pain management: pain level controlled Vital Signs Assessment: post-procedure vital signs reviewed and stable Respiratory status: spontaneous breathing Cardiovascular status: blood pressure returned to baseline Anesthetic complications: no   No complications documented.   Last Vitals:  Vitals:   07/30/20 1500 07/30/20 1511  BP: (!) 134/93 (!) 133/98  Pulse: 74 68  Resp: 15 (!) 22  Temp:    SpO2: 99% 98%    Last Pain:  Vitals:   07/30/20 1511  TempSrc:   PainSc: 0-No pain                 Ronan Duecker

## 2020-07-30 NOTE — H&P (Signed)
Outpatient short stay form Pre-procedure 07/30/2020 2:07 PM Michael Murillo K. Alice Reichert, M.D.  Primary Physician: Frazier Richards III, M.D.  Reason for visit:  Family history of colon polyps  History of present illness:    59year old patient presenting for family history of colon cancer. Patient denies any change in bowel habits, rectal bleeding or involuntary weight loss.     Current Facility-Administered Medications:  .  0.9 %  sodium chloride infusion, , Intravenous, Continuous, [place], Benay Pike, MD, Last Rate: 20 mL/hr at 07/30/20 1334, Continued from Pre-op at 07/30/20 1334  Medications Prior to Admission  Medication Sig Dispense Refill Last Dose  . atorvastatin (LIPITOR) 40 MG tablet TAKE ONE TABLET BY MOUTH DAILY   Past Week at Unknown time  . meloxicam (MOBIC) 15 MG tablet TAKE ONE TABLET BY MOUTH DAILY 30 tablet 3 Past Week at Unknown time  . terbinafine (LAMISIL) 250 MG tablet Take 1 tablet (250 mg total) by mouth daily. 90 tablet 0 Past Month at Unknown time  . ciclopirox (LOPROX) 0.77 % cream      . ciclopirox (LOPROX) 0.77 % cream Apply topically 2 (two) times daily. Apply to feet and in between toes 30 g 5   . fluticasone (FLONASE) 50 MCG/ACT nasal spray SPRAY ONE SPRAY INTO BOTH NOSTILS TWO TIMES A DAY     . ondansetron (ZOFRAN) 4 MG tablet Take 1 tablet (4 mg total) by mouth every 8 (eight) hours as needed. 20 tablet 0   . sildenafil (REVATIO) 20 MG tablet TAKE 2-3 TABLETS BY MOUTH DAILY AS NEEDED        Allergies  Allergen Reactions  . Allegra [Fexofenadine] Rash     Past Medical History:  Diagnosis Date  . Actinic keratosis   . Basal cell carcinoma 07/23/2015   Right upper back. Nodular pattern. EDC  . Basal cell carcinoma 07/23/2015   Left forearm. Nodular pattern. Excision: 08/20/2015  . Sleep apnea     Review of systems:  Otherwise negative.    Physical Exam  Gen: Alert, oriented. Appears stated age.  HEENT: /AT. PERRLA. Lungs: CTA, no wheezes. CV:  RR nl S1, S2. Abd: soft, benign, no masses. BS+ Ext: No edema. Pulses 2+    Planned procedures: Proceed with colonoscopy. The patient understands the nature of the planned procedure, indications, risks, alternatives and potential complications including but not limited to bleeding, infection, perforation, damage to internal organs and possible oversedation/side effects from anesthesia. The patient agrees and gives consent to proceed.  Please refer to procedure notes for findings, recommendations and patient disposition/instructions.     Michael Murillo K. Alice Reichert, M.D. Gastroenterology 07/30/2020  2:07 PM

## 2020-07-30 NOTE — Interval H&P Note (Signed)
History and Physical Interval Note:  07/30/2020 2:08 PM  Michael Murillo  has presented today for surgery, with the diagnosis of FH POLYPS.  The various methods of treatment have been discussed with the patient and family. After consideration of risks, benefits and other options for treatment, the patient has consented to  Procedure(s): COLONOSCOPY WITH PROPOFOL (N/A) as a surgical intervention.  The patient's history has been reviewed, patient examined, no change in status, stable for surgery.  I have reviewed the patient's chart and labs.  Questions were answered to the patient's satisfaction.     [place], [place]

## 2020-07-31 ENCOUNTER — Encounter: Payer: Self-pay | Admitting: Internal Medicine

## 2020-08-01 ENCOUNTER — Ambulatory Visit (INDEPENDENT_AMBULATORY_CARE_PROVIDER_SITE_OTHER): Payer: PRIVATE HEALTH INSURANCE | Admitting: Podiatry

## 2020-08-01 ENCOUNTER — Other Ambulatory Visit: Payer: Self-pay

## 2020-08-01 ENCOUNTER — Ambulatory Visit: Payer: PRIVATE HEALTH INSURANCE

## 2020-08-01 ENCOUNTER — Encounter: Payer: Self-pay | Admitting: Podiatry

## 2020-08-01 DIAGNOSIS — M2022 Hallux rigidus, left foot: Secondary | ICD-10-CM

## 2020-08-01 DIAGNOSIS — Z9889 Other specified postprocedural states: Secondary | ICD-10-CM

## 2020-08-01 DIAGNOSIS — S86012D Strain of left Achilles tendon, subsequent encounter: Secondary | ICD-10-CM

## 2020-08-01 LAB — SURGICAL PATHOLOGY

## 2020-08-01 MED ORDER — METHYLPREDNISOLONE 4 MG PO TBPK: ORAL_TABLET | ORAL | 0 refills | Status: DC

## 2020-08-01 NOTE — Progress Notes (Signed)
He presents today for follow-up of his surgery to his left foot date of surgery 05/04/2020 Achilles tenolysis gastroc recession Keller arthroplasty with single silicone implant states that the ankle is a little stiff but it feels okay states that his knee is really starting to become painful.  He feels that is probably due to the way that he is walking favoring that foot.  Objective: Vital signs are stable alert and oriented x3 has great range of motion and strength when evaluation of the left foot and the Achilles.  Has great range of motion of the first metatarsophalangeal joint as well.  Assessment: Well-healing surgical foot.  Plan: I will start him on a Medrol Dosepak and I will follow-up with him in 1 month prior to him going back to work in February.

## 2020-08-24 DIAGNOSIS — M79676 Pain in unspecified toe(s): Secondary | ICD-10-CM

## 2020-09-05 ENCOUNTER — Encounter: Payer: Self-pay | Admitting: Podiatry

## 2020-09-05 ENCOUNTER — Other Ambulatory Visit: Payer: Self-pay

## 2020-09-05 ENCOUNTER — Ambulatory Visit (INDEPENDENT_AMBULATORY_CARE_PROVIDER_SITE_OTHER): Payer: BLUE CROSS/BLUE SHIELD

## 2020-09-05 ENCOUNTER — Encounter: Payer: Self-pay | Admitting: *Deleted

## 2020-09-05 ENCOUNTER — Ambulatory Visit (INDEPENDENT_AMBULATORY_CARE_PROVIDER_SITE_OTHER): Payer: BLUE CROSS/BLUE SHIELD | Admitting: Podiatry

## 2020-09-05 DIAGNOSIS — S86012D Strain of left Achilles tendon, subsequent encounter: Secondary | ICD-10-CM

## 2020-09-05 DIAGNOSIS — Z9889 Other specified postprocedural states: Secondary | ICD-10-CM | POA: Diagnosis not present

## 2020-09-05 DIAGNOSIS — M2022 Hallux rigidus, left foot: Secondary | ICD-10-CM | POA: Diagnosis not present

## 2020-09-05 NOTE — Progress Notes (Signed)
He presents today for follow-up of his Achilles tenolysis retrocalcaneal heel spur resection Keller arthroplasty with single silicone implant he states that still sometimes it sore around the ankle still swells a bit, but overall is doing very well compared to where we come from.  Objective: Vital signs stable he is alert oriented x3 there is no erythema no cellulitis drainage odor just mild edema at the surgical sites.  Great range of motion of the first metatarsophalangeal joint good plantar flexion against resistance margins of the Achilles appear to be firm and sharp as we descend toward the calcaneus they become a little more diffuse.  Radiographs taken today demonstrate soft tissue swelling the distalmost aspect of the Achilles otherwise swelling of the foot is coming down very nicely.  Assessment: Well-healing surgical foot.  Plan: I will let him get back to work in the next few weeks however I did ask him to increase his ability to walk further and do more every day I recommend he do this prior to go back to work I will follow-up with him in the near future.

## 2020-10-10 ENCOUNTER — Encounter: Payer: Self-pay | Admitting: Podiatry

## 2020-10-10 ENCOUNTER — Ambulatory Visit (INDEPENDENT_AMBULATORY_CARE_PROVIDER_SITE_OTHER): Payer: BLUE CROSS/BLUE SHIELD | Admitting: Podiatry

## 2020-10-10 ENCOUNTER — Other Ambulatory Visit: Payer: Self-pay

## 2020-10-10 ENCOUNTER — Ambulatory Visit (INDEPENDENT_AMBULATORY_CARE_PROVIDER_SITE_OTHER): Payer: BLUE CROSS/BLUE SHIELD

## 2020-10-10 DIAGNOSIS — Z9889 Other specified postprocedural states: Secondary | ICD-10-CM

## 2020-10-10 DIAGNOSIS — S86012D Strain of left Achilles tendon, subsequent encounter: Secondary | ICD-10-CM

## 2020-10-10 DIAGNOSIS — M2022 Hallux rigidus, left foot: Secondary | ICD-10-CM

## 2020-10-10 NOTE — Progress Notes (Signed)
He presents today date of surgery May 04, 2020 status post Achilles tenolysis retrocalcaneal spur resection gastroc recession Keller arthroplasty with a single silicone implant and a cast he states that he is 100% better he is very happy with the outcome he states my foot has moved like this for many years.  Objective: Vital signs are stable he is alert oriented x3 his great range of motion of the first metatarsophalangeal joint scars gone on to heal uneventfully.  The posterior aspect of the leg demonstrates no significant scarring he has some good margins on palpation of the Achilles tendon and no tenderness on plantarflexion or dorsiflexion against resistance no tenderness on palpation of the surgical site.  Assessment: Well-healing surgical foot and leg.  Plan: I am going allow him to get back to his full activity level and I will follow-up with him on an as-needed basis.

## 2020-11-13 ENCOUNTER — Encounter: Payer: Self-pay | Admitting: Dermatology

## 2020-11-13 ENCOUNTER — Other Ambulatory Visit: Payer: Self-pay

## 2020-11-13 ENCOUNTER — Ambulatory Visit: Payer: BLUE CROSS/BLUE SHIELD | Admitting: Dermatology

## 2020-11-13 DIAGNOSIS — L821 Other seborrheic keratosis: Secondary | ICD-10-CM

## 2020-11-13 DIAGNOSIS — L570 Actinic keratosis: Secondary | ICD-10-CM | POA: Diagnosis not present

## 2020-11-13 DIAGNOSIS — Z1283 Encounter for screening for malignant neoplasm of skin: Secondary | ICD-10-CM

## 2020-11-13 DIAGNOSIS — D1800 Hemangioma unspecified site: Secondary | ICD-10-CM

## 2020-11-13 DIAGNOSIS — Z85828 Personal history of other malignant neoplasm of skin: Secondary | ICD-10-CM | POA: Diagnosis not present

## 2020-11-13 DIAGNOSIS — L918 Other hypertrophic disorders of the skin: Secondary | ICD-10-CM

## 2020-11-13 DIAGNOSIS — L578 Other skin changes due to chronic exposure to nonionizing radiation: Secondary | ICD-10-CM | POA: Diagnosis not present

## 2020-11-13 DIAGNOSIS — L814 Other melanin hyperpigmentation: Secondary | ICD-10-CM

## 2020-11-13 DIAGNOSIS — L738 Other specified follicular disorders: Secondary | ICD-10-CM

## 2020-11-13 DIAGNOSIS — D229 Melanocytic nevi, unspecified: Secondary | ICD-10-CM

## 2020-11-13 NOTE — Patient Instructions (Addendum)
Melanoma ABCDEs  Melanoma is the most dangerous type of skin cancer, and is the leading cause of death from skin disease.  You are more likely to develop melanoma if you:  Have light-colored skin, light-colored eyes, or red or blond hair  Spend a lot of time in the sun  Tan regularly, either outdoors or in a tanning bed  Have had blistering sunburns, especially during childhood  Have a close family member who has had a melanoma  Have atypical moles or large birthmarks  Early detection of melanoma is key since treatment is typically straightforward and cure rates are extremely high if we catch it early.   The first sign of melanoma is often a change in a mole or a new dark spot.  The ABCDE system is a way of remembering the signs of melanoma.  A for asymmetry:  The two halves do not match. B for border:  The edges of the growth are irregular. C for color:  A mixture of colors are present instead of an even brown color. D for diameter:  Melanomas are usually (but not always) greater than 14mm - the size of a pencil eraser. E for evolution:  The spot keeps changing in size, shape, and color.  Please check your skin once per month between visits. You can use a small mirror in front and a large mirror behind you to keep an eye on the back side or your body.   If you see any new or changing lesions before your next follow-up, please call to schedule a visit.  Please continue daily skin protection including broad spectrum sunscreen SPF 30+ to sun-exposed areas, reapplying every 2 hours as needed when you're outdoors.   Staying in the shade or wearing long sleeves, sun glasses (UVA+UVB protection) and wide brim hats (4-inch brim around the entire circumference of the hat) are also recommended for sun protection.   Cryotherapy Aftercare  . Wash gently with soap and water everyday.   Marland Kitchen Apply Vaseline and Band-Aid daily until healed.

## 2020-11-13 NOTE — Progress Notes (Signed)
Follow-Up Visit   Subjective  Michael Murillo is a 60 y.o. male who presents for the following: upper body exam (Patient here today for 6 month upper body exam. He has history of basal cell carcinoma. He denies any concerns today at appointment ).   The following portions of the chart were reviewed this encounter and updated as appropriate:      Objective  Well appearing patient in no apparent distress; mood and affect are within normal limits.  A focused examination was performed including upper extremities, including the arms, hands, fingers, and fingernails. Relevant physical exam findings are noted in the Assessment and Plan.  Objective  frontal scalp x 3 (3): Erythematous thin papules/macules with gritty scale.   Objective  Mid Back: Fleshy, skin-colored pedunculated papules.    Assessment & Plan  Actinic keratosis (3) frontal scalp x 3  Good result post one PDT treatment  Prior to procedure, discussed risks of blister formation, small wound, skin dyspigmentation, or rare scar following cryotherapy.      Destruction of lesion - frontal scalp x 3  Destruction method: cryotherapy   Informed consent: discussed and consent obtained   Lesion destroyed using liquid nitrogen: Yes   Region frozen until ice ball extended beyond lesion: Yes   Outcome: patient tolerated procedure well with no complications   Post-procedure details: wound care instructions given    Acrochordon Mid Back  Benign-appearing.  Observation.  Call clinic for new or changing lesions.  Recommend daily use of broad spectrum spf 30+ sunscreen to sun-exposed areas.    Lentigines - Scattered tan macules - Due to sun exposure - Benign-appering, observe - Recommend daily broad spectrum sunscreen SPF 30+ to sun-exposed areas, reapply every 2 hours as needed. - Call for any changes  Seborrheic Keratoses - Stuck-on, waxy, tan-brown papules and/or plaques on left temporal scalp  -  Benign-appearing - Discussed benign etiology and prognosis. - Observe - Call for any changes  Sebaceous Hyperplasia - Small yellow papules with a central dell right malar cheek 3 mm pink flesh papule  - Benign - Observe  Melanocytic Nevi - Tan-brown and/or pink-flesh-colored symmetric macules and papules - Benign appearing on exam today - Observation - Call clinic for new or changing moles - Recommend daily use of broad spectrum spf 30+ sunscreen to sun-exposed areas.   Hemangiomas - Red papules - Discussed benign nature - Observe - Call for any changes  Actinic Damage - Chronic condition, secondary to cumulative UV/sun exposure - diffuse scaly erythematous macules with underlying dyspigmentation - Recommend daily broad spectrum sunscreen SPF 30+ to sun-exposed areas, reapply every 2 hours as needed.  - Staying in the shade or wearing long sleeves, sun glasses (UVA+UVB protection) and wide brim hats (4-inch brim around the entire circumference of the hat) are also recommended for sun protection.  - Call for new or changing lesions. -may do second PDT of scalp in fall  History of Basal Cell Carcinoma of the Skin - No evidence of recurrence today right upper back and left forearm  - Recommend regular full body skin exams - Recommend daily broad spectrum sunscreen SPF 30+ to sun-exposed areas, reapply every 2 hours as needed.  - Call if any new or changing lesions are noted between office visits   Skin cancer screening performed today.  Return in about 6 months (around 05/15/2021) for upper body exam, AKs scalp .   I, Ruthell Rummage, CMA, am acting as scribe for Brendolyn Patty, MD.  Documentation:  I have reviewed the above documentation for accuracy and completeness, and I agree with the above.  Brendolyn Patty MD

## 2021-05-28 ENCOUNTER — Ambulatory Visit: Payer: PRIVATE HEALTH INSURANCE | Admitting: Dermatology

## 2021-05-29 ENCOUNTER — Ambulatory Visit: Payer: BLUE CROSS/BLUE SHIELD | Admitting: Dermatology

## 2021-05-29 ENCOUNTER — Other Ambulatory Visit: Payer: Self-pay

## 2021-05-29 DIAGNOSIS — Z1283 Encounter for screening for malignant neoplasm of skin: Secondary | ICD-10-CM | POA: Diagnosis not present

## 2021-05-29 DIAGNOSIS — L814 Other melanin hyperpigmentation: Secondary | ICD-10-CM

## 2021-05-29 DIAGNOSIS — L570 Actinic keratosis: Secondary | ICD-10-CM | POA: Diagnosis not present

## 2021-05-29 DIAGNOSIS — L821 Other seborrheic keratosis: Secondary | ICD-10-CM

## 2021-05-29 DIAGNOSIS — D224 Melanocytic nevi of scalp and neck: Secondary | ICD-10-CM

## 2021-05-29 DIAGNOSIS — L219 Seborrheic dermatitis, unspecified: Secondary | ICD-10-CM | POA: Diagnosis not present

## 2021-05-29 DIAGNOSIS — L918 Other hypertrophic disorders of the skin: Secondary | ICD-10-CM

## 2021-05-29 DIAGNOSIS — L578 Other skin changes due to chronic exposure to nonionizing radiation: Secondary | ICD-10-CM

## 2021-05-29 DIAGNOSIS — D229 Melanocytic nevi, unspecified: Secondary | ICD-10-CM

## 2021-05-29 DIAGNOSIS — Z872 Personal history of diseases of the skin and subcutaneous tissue: Secondary | ICD-10-CM

## 2021-05-29 DIAGNOSIS — Z85828 Personal history of other malignant neoplasm of skin: Secondary | ICD-10-CM

## 2021-05-29 NOTE — Progress Notes (Signed)
Follow-Up Visit   Subjective  Michael Murillo is a 60 y.o. male who presents for the following: Follow-up.  Patient here for 6 month follow-up and UBSE. He has a history of BCC of the right upper back and left forearm. He has a history of Aks, PDT treatment to the scalp 06/11/20. No spot of concern today.   The following portions of the chart were reviewed this encounter and updated as appropriate:       Review of Systems:  No other skin or systemic complaints except as noted in HPI or Assessment and Plan.  Objective  Well appearing patient in no apparent distress; mood and affect are within normal limits.  All skin waist up examined.  Scalp Pink scaliness   R inferior vertex x 1, L hand dorsum x 2 (3) Pink/brown scaly macules.   Assessment & Plan  Skin cancer screening performed today.  Actinic Damage - chronic, secondary to cumulative UV radiation exposure/sun exposure over time - diffuse scaly erythematous macules with underlying dyspigmentation - Recommend daily broad spectrum sunscreen SPF 30+ to sun-exposed areas, reapply every 2 hours as needed.  - Recommend staying in the shade or wearing long sleeves, sun glasses (UVA+UVB protection) and wide brim hats (4-inch brim around the entire circumference of the hat). - Call for new or changing lesions.  Lentigines - Scattered tan macules - Due to sun exposure - Benign-appering, observe - Recommend daily broad spectrum sunscreen SPF 30+ to sun-exposed areas, reapply every 2 hours as needed. - Call for any changes  Seborrheic Keratoses - Stuck-on, waxy, tan-brown papules and/or plaques, including right sideburn, left temporal scalp  - Benign-appearing - Discussed benign etiology and prognosis. - Observe - Call for any changes  Seborrheic dermatitis Scalp  Seborrheic Dermatitis  -  is a chronic persistent rash characterized by pinkness and scaling most commonly of the mid face but also can occur on the scalp  (dandruff), ears; mid chest, mid back and groin.  It tends to be exacerbated by stress and cooler weather.  People who have neurologic disease may experience new onset or exacerbation of existing seborrheic dermatitis.  The condition is not curable but treatable and can be controlled.  Start Head & Shoulders shampoo - massage into scalp and let sit several minutes before rinsing.   AK (actinic keratosis) (3) R inferior vertex x 1, L hand dorsum x 2  Actinic keratoses are precancerous spots that appear secondary to cumulative UV radiation exposure/sun exposure over time. They are chronic with expected duration over 1 year. A portion of actinic keratoses will progress to squamous cell carcinoma of the skin. It is not possible to reliably predict which spots will progress to skin cancer and so treatment is recommended to prevent development of skin cancer.  Recommend daily broad spectrum sunscreen SPF 30+ to sun-exposed areas, reapply every 2 hours as needed.  Recommend staying in the shade or wearing long sleeves, sun glasses (UVA+UVB protection) and wide brim hats (4-inch brim around the entire circumference of the hat). Call for new or changing lesions.  Destruction of lesion - R inferior vertex x 1, L hand dorsum x 2  Destruction method: cryotherapy   Informed consent: discussed and consent obtained   Lesion destroyed using liquid nitrogen: Yes   Region frozen until ice ball extended beyond lesion: Yes   Outcome: patient tolerated procedure well with no complications   Post-procedure details: wound care instructions given   Additional details:  Prior to procedure, discussed  risks of blister formation, small wound, skin dyspigmentation, or rare scar following cryotherapy. Recommend Vaseline ointment to treated areas while healing.   Acrochordons (Skin Tags) - Fleshy, skin-colored pedunculated papules on the neck - Benign appearing.  - Observe. - If desired, they can be removed with an in  office procedure that is not covered by insurance. - Please call the clinic if you notice any new or changing lesions.  Melanocytic Nevi - Tan-brown and/or pink-flesh-colored symmetric macules and papules, including R post neck - Benign appearing on exam today - Observation - Call clinic for new or changing moles - Recommend daily use of broad spectrum spf 30+ sunscreen to sun-exposed areas.   Return in about 6 months (around 11/27/2021) for AKs.  IJamesetta Orleans, CMA, am acting as scribe for Brendolyn Patty, MD . Documentation: I have reviewed the above documentation for accuracy and completeness, and I agree with the above.  Brendolyn Patty MD

## 2021-05-29 NOTE — Patient Instructions (Addendum)

## 2021-10-09 ENCOUNTER — Emergency Department
Admission: EM | Admit: 2021-10-09 | Discharge: 2021-10-09 | Disposition: A | Discharge status: Home / Self Care | Payer: Worker's Compensation | Attending: Emergency Medicine | Admitting: Emergency Medicine

## 2021-10-09 ENCOUNTER — Emergency Department: Payer: Worker's Compensation

## 2021-10-09 ENCOUNTER — Other Ambulatory Visit: Payer: Self-pay

## 2021-10-09 DIAGNOSIS — S61411A Laceration without foreign body of right hand, initial encounter: Secondary | ICD-10-CM | POA: Diagnosis not present

## 2021-10-09 DIAGNOSIS — Y990 Civilian activity done for income or pay: Secondary | ICD-10-CM | POA: Diagnosis not present

## 2021-10-09 DIAGNOSIS — S60221A Contusion of right hand, initial encounter: Secondary | ICD-10-CM

## 2021-10-09 DIAGNOSIS — S6991XA Unspecified injury of right wrist, hand and finger(s), initial encounter: Secondary | ICD-10-CM | POA: Diagnosis present

## 2021-10-09 DIAGNOSIS — W232XXA Caught, crushed, jammed or pinched between a moving and stationary object, initial encounter: Secondary | ICD-10-CM | POA: Insufficient documentation

## 2021-10-09 MED ORDER — MELOXICAM 15 MG PO TABS: 15.0000 mg | ORAL_TABLET | Freq: Every day | ORAL | 0 refills | Status: AC

## 2021-10-09 NOTE — ED Notes (Signed)
UDS for WC was preformed. This tech ambulated urine to the lab and placed it in the appropriate place. Urine was documented appropriately in lab.  ?
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
## 2021-10-09 NOTE — ED Notes (Signed)
Pt unable to urinate at this time. Will call this tech when he is able to urinate so that [facility] can be preformed.  ?
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
## 2021-12-10 ENCOUNTER — Ambulatory Visit: Payer: BLUE CROSS/BLUE SHIELD | Admitting: Dermatology

## 2021-12-10 DIAGNOSIS — L578 Other skin changes due to chronic exposure to nonionizing radiation: Secondary | ICD-10-CM | POA: Diagnosis not present

## 2021-12-10 DIAGNOSIS — L570 Actinic keratosis: Secondary | ICD-10-CM | POA: Diagnosis not present

## 2021-12-10 DIAGNOSIS — L821 Other seborrheic keratosis: Secondary | ICD-10-CM

## 2021-12-10 DIAGNOSIS — Z85828 Personal history of other malignant neoplasm of skin: Secondary | ICD-10-CM | POA: Diagnosis not present

## 2021-12-10 DIAGNOSIS — L738 Other specified follicular disorders: Secondary | ICD-10-CM

## 2021-12-10 NOTE — Patient Instructions (Signed)
Cryotherapy Aftercare ? ?Wash gently with soap and water everyday.   ?Apply Vaseline and Band-Aid daily until healed.  ? ? ?Recommend daily broad spectrum sunscreen SPF 30+ to sun-exposed areas, reapply every 2 hours as needed. Call for new or changing lesions.  ?Staying in the shade or wearing long sleeves, sun glasses (UVA+UVB protection) and wide brim hats (4-inch brim around the entire circumference of the hat) are also recommended for sun protection.  ? ? ?If You Need Anything After Your Visit ? ?If you have any questions or concerns for your doctor, please call our main line at [phone] and press option 4 to reach your doctor's medical assistant. If no one answers, please leave a voicemail as directed and we will return your call as soon as possible. Messages left after 4 pm will be answered the following business day.  ? ?You may also send [place] a message via MyChart. We typically respond to MyChart messages within 1-2 business days. ? ?For prescription refills, please ask your pharmacy to contact our office. Our fax number is [phone]. ? ?If you have an urgent issue when the clinic is closed that cannot wait until the next business day, you can page your doctor at the number below.   ? ?Please note that while we do our best to be available for urgent issues outside of office hours, we are not available 24/7.  ? ?If you have an urgent issue and are unable to reach [place], you may choose to seek medical care at your doctor's office, retail clinic, urgent care center, or emergency room. ? ?If you have a medical emergency, please immediately call 911 or go to the emergency department. ? ?Pager Numbers ? ?- Dr. Nehemiah Massed: [phone] ? ?- Dr. Laurence Ferrari: [phone] ? ?- Dr. Nicole Kindred: [phone] ? ?In the event of inclement weather, please call our main line at [phone] for an update on the status of any delays or closures. ? ?Dermatology Medication Tips: ?Please keep the boxes that topical medications come in  in order to help keep track of the instructions about where and how to use these. Pharmacies typically print the medication instructions only on the boxes and not directly on the medication tubes.  ? ?If your medication is too expensive, please contact our office at [phone] option 4 or send [place] a message through [place].  ? ?We are unable to tell what your co-pay for medications will be in advance as this is different depending on your insurance coverage. However, we may be able to find a substitute medication at lower cost or fill out paperwork to get insurance to cover a needed medication.  ? ?If a prior authorization is required to get your medication covered by your insurance company, please allow [place] 1-2 business days to complete this process. ? ?Drug prices often vary depending on where the prescription is filled and some pharmacies may offer cheaper prices. ? ?The website www.goodrx.com contains coupons for medications through different pharmacies. The prices here do not account for what the cost may be with help from insurance (it may be cheaper with your insurance), but the website can give you the price if you did not use any insurance.  ?- You can print the associated coupon and take it with your prescription to the pharmacy.  ?- You may also stop by our office during regular business hours and pick up a GoodRx coupon card.  ?- If you need your prescription sent electronically to a different pharmacy, notify our office  through [facility] or by phone at [phone] option 4. ? ? ? ? ?Si Usted Necesita Algo Despu?s de Su Visita ? ?Tambi?n puede enviarnos un mensaje a trav?s de MyChart. Por lo general respondemos a los mensajes de MyChart en el transcurso de 1 a 2 d?as h?biles. ? ?Para renovar recetas, por favor pida a su farmacia que se ponga en contacto con nuestra oficina. Nuestro n?mero de fax es el [phone]. ? ?Si tiene un asunto urgente cuando la cl?nica est? cerrada y que no puede  esperar hasta el siguiente d?a h?bil, puede llamar/localizar a su doctor(a) al n?mero que aparece a continuaci?n.  ? ?Por favor, tenga en cuenta que aunque hacemos todo lo posible para estar disponibles para asuntos urgentes fuera del horario de oficina, no estamos disponibles las 24 horas del d?a, los 7 d?as de la semana.  ? ?Si tiene un problema urgente y no puede comunicarse con nosotros, puede optar por buscar atenci?n m?dica  en el consultorio de su doctor(a), en una cl?nica privada, en un centro de atenci?n urgente o en una sala de emergencias. ? ?Si tiene Engineer, maintenance (IT) m?dica, por favor llame inmediatamente al 911 o vaya a la sala de emergencias. ? ?N?meros de b?per ? ?- Dr. Nehemiah Massed: [phone] ? ?- Dra. Moye: [phone] ? ?- Dra. Nicole Kindred: [phone] ? ?En caso de inclemencias del tiempo, por favor llame a nuestra l?nea principal al [phone] para una actualizaci?n sobre el estado de cualquier retraso o cierre. ? ?Consejos para la medicaci?n en dermatolog?a: ?Por favor, guarde las cajas en las que vienen los medicamentos de uso t?pico para ayudarle a seguir las instrucciones sobre d?nde y c?mo usarlos. Las farmacias generalmente imprimen las instrucciones del medicamento s?lo en las cajas y no directamente en los tubos del [place].  ? ?Si su medicamento es muy caro, por favor, p?ngase en contacto con Zigmund Daniel llamando al [phone] y presione la opci?n 4 o env?enos un mensaje a trav?s de MyChart.  ? ?No podemos decirle cu?l ser? su copago por los medicamentos por adelantado ya que esto es diferente dependiendo de la cobertura de su seguro. Sin embargo, es posible que podamos encontrar un medicamento sustituto a Electrical engineer un formulario para que el seguro cubra el medicamento que se considera necesario.  ? ?Si se requiere Ardelia Mems autorizaci?n previa para que su compa??a de seguros [place] su medicamento, por favor perm?tanos de 1 a 2 d?as h?biles para completar este proceso. ? ?Los  precios de los medicamentos var?an con frecuencia dependiendo del Environmental consultant de d?nde se surte la receta y alguna farmacias pueden ofrecer precios m?s baratos. ? ?El sitio web www.goodrx.com tiene cupones para medicamentos de Airline pilot. Los precios aqu? no tienen en cuenta lo que podr?a costar con la ayuda del seguro (puede ser m?s barato con su seguro), pero el sitio web puede darle el precio si no utiliz? ning?n seguro.  ?- Puede imprimir el cup?n correspondiente y llevarlo con su receta a la farmacia.  ?- Tambi?n puede pasar por nuestra oficina durante el horario de atenci?n regular y recoger una tarjeta de cupones de GoodRx.  ?- Si necesita que su receta se env?e electr?nicamente a Chiropodist, informe a nuestra oficina a trav?s de MyChart de [place] o por tel?fono llamando al [phone] y presione la opci?n 4. ? ?

## 2021-12-10 NOTE — Progress Notes (Signed)
? ?  Follow-Up Visit ?  ?Subjective  ?Michael Murillo is a 61 y.o. male who presents for the following: Follow-up (Patient here today for 6 month AK follow up at R inferior vertex x 1, L hand dorsum x 2 treated with LN2 at last visit. ). ? ?Patient has done PDT in past to treat AK's. Patient with hx of BCC.  ? ?The following portions of the chart were reviewed this encounter and updated as appropriate:  ?  ?  ? ?Review of Systems:  No other skin or systemic complaints except as noted in HPI or Assessment and Plan. ? ?Objective  ?Well appearing patient in no apparent distress; mood and affect are within normal limits. ? ?A focused examination was performed including scalp, hands, face. Relevant physical exam findings are noted in the Assessment and Plan. ? ?vertex x 2, mid forehead x 3, L temple x 1, L hand dorsum x 1 (7) ?Pink scaly macules ? ?face ?Small yellow papules with a central dell. ? ? ? ? ? ?Assessment & Plan  ?AK (actinic keratosis) (7) ?vertex x 2, mid forehead x 3, L temple x 1, L hand dorsum x 1 ? ?Destruction of lesion - vertex x 2, mid forehead x 3, L temple x 1, L hand dorsum x 1 ? ?Destruction method: cryotherapy   ?Informed consent: discussed and consent obtained   ?Lesion destroyed using liquid nitrogen: Yes   ?Region frozen until ice ball extended beyond lesion: Yes   ?Outcome: patient tolerated procedure well with no complications   ?Post-procedure details: wound care instructions given   ?Additional details:  Prior to procedure, discussed risks of blister formation, small wound, skin dyspigmentation, or rare scar following cryotherapy. Recommend Vaseline ointment to treated areas while healing.  ? ?Sebaceous hyperplasia ?face ? ?Benign, observe.  ? ? ?Seborrheic Keratoses ?- Stuck-on, waxy, tan-brown papules and/or plaques  ?- Benign-appearing ?- Discussed benign etiology and prognosis. ?- Observe ?- Call for any changes ? ?Actinic Damage ?- chronic, secondary to cumulative UV radiation  exposure/sun exposure over time ?- diffuse scaly erythematous macules with underlying dyspigmentation ?- Recommend daily broad spectrum sunscreen SPF 30+ to sun-exposed areas, reapply every 2 hours as needed.  ?- Recommend staying in the shade or wearing long sleeves, sun glasses (UVA+UVB protection) and wide brim hats (4-inch brim around the entire circumference of the hat). ?- Call for new or changing lesions. ? ?History of Basal Cell Carcinoma of the Skin ?- No evidence of recurrence today ?- Recommend regular full body skin exams ?- Recommend daily broad spectrum sunscreen SPF 30+ to sun-exposed areas, reapply every 2 hours as needed.  ?- Call if any new or changing lesions are noted between office visits  ? ?Return in about 6 months (around 06/12/2022) for UBSE. ? ?Graciella Belton, RMA, am acting as scribe for Brendolyn Patty, MD . ? ?Documentation: I have reviewed the above documentation for accuracy and completeness, and I agree with the above. ? ?Brendolyn Patty MD  ? ?

## 2022-06-09 ENCOUNTER — Ambulatory Visit: Payer: Self-pay | Admitting: Dermatology
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
## 2023-09-01 ENCOUNTER — Ambulatory Visit: Payer: BLUE CROSS/BLUE SHIELD | Admitting: Dermatology

## 2023-09-22 ENCOUNTER — Ambulatory Visit: Payer: Managed Care, Other (non HMO) | Admitting: Dermatology

## 2023-09-22 DIAGNOSIS — L570 Actinic keratosis: Secondary | ICD-10-CM | POA: Diagnosis not present

## 2023-09-22 DIAGNOSIS — L738 Other specified follicular disorders: Secondary | ICD-10-CM

## 2023-09-22 DIAGNOSIS — Z1283 Encounter for screening for malignant neoplasm of skin: Secondary | ICD-10-CM

## 2023-09-22 DIAGNOSIS — L820 Inflamed seborrheic keratosis: Secondary | ICD-10-CM | POA: Diagnosis not present

## 2023-09-22 DIAGNOSIS — L918 Other hypertrophic disorders of the skin: Secondary | ICD-10-CM

## 2023-09-22 DIAGNOSIS — L578 Other skin changes due to chronic exposure to nonionizing radiation: Secondary | ICD-10-CM | POA: Diagnosis not present

## 2023-09-22 DIAGNOSIS — W908XXA Exposure to other nonionizing radiation, initial encounter: Secondary | ICD-10-CM | POA: Diagnosis not present

## 2023-09-22 DIAGNOSIS — L814 Other melanin hyperpigmentation: Secondary | ICD-10-CM | POA: Diagnosis not present

## 2023-09-22 DIAGNOSIS — L821 Other seborrheic keratosis: Secondary | ICD-10-CM

## 2023-09-22 DIAGNOSIS — Z85828 Personal history of other malignant neoplasm of skin: Secondary | ICD-10-CM

## 2023-09-22 DIAGNOSIS — D1801 Hemangioma of skin and subcutaneous tissue: Secondary | ICD-10-CM

## 2023-09-22 DIAGNOSIS — D229 Melanocytic nevi, unspecified: Secondary | ICD-10-CM

## 2023-09-22 NOTE — Progress Notes (Signed)
Follow-Up Visit   Subjective  Michael Murillo is a 63 y.o. male who presents for the following: Skin Cancer Screening and Upper Body Skin Exam, hx of BCC, Aks, check spot L forearm- raised and pink, check scab L dorsum hand, won't heal  The patient presents for Upper Body Skin Exam (UBSE) for skin cancer screening and mole check. The patient has spots, moles and lesions to be evaluated, some may be new or changing and the patient may have concern these could be cancer.    The following portions of the chart were reviewed this encounter and updated as appropriate: medications, allergies, medical history  Review of Systems:  No other skin or systemic complaints except as noted in HPI or Assessment and Plan.  Objective  Well appearing patient in no apparent distress; mood and affect are within normal limits.  All skin waist up examined. Relevant physical exam findings are noted in the Assessment and Plan.  L hand dorsum x 1 Hyperkeratotic papule L nasal dorsum x 1, R frontal scalp x 1, R temple x 1 (3) Pink scaly macules L forearm x 1 Stuck on waxy papule with erythema  Assessment & Plan   HYPERTROPHIC ACTINIC KERATOSIS L hand dorsum x 1 Actinic keratoses are precancerous spots that appear secondary to cumulative UV radiation exposure/sun exposure over time. They are chronic with expected duration over 1 year. A portion of actinic keratoses will progress to squamous cell carcinoma of the skin. It is not possible to reliably predict which spots will progress to skin cancer and so treatment is recommended to prevent development of skin cancer.  Recommend daily broad spectrum sunscreen SPF 30+ to sun-exposed areas, reapply every 2 hours as needed.  Recommend staying in the shade or wearing long sleeves, sun glasses (UVA+UVB protection) and wide brim hats (4-inch brim around the entire circumference of the hat). Call for new or changing lesions.  Destruction of lesion - L hand  dorsum x 1  Destruction method: cryotherapy   Informed consent: discussed and consent obtained   Lesion destroyed using liquid nitrogen: Yes   Region frozen until ice ball extended beyond lesion: Yes   Outcome: patient tolerated procedure well with no complications   Post-procedure details: wound care instructions given   Additional details:  Prior to procedure, discussed risks of blister formation, small wound, skin dyspigmentation, or rare scar following cryotherapy. Recommend Vaseline ointment to treated areas while healing.  AK (ACTINIC KERATOSIS) (3) L nasal dorsum x 1, R frontal scalp x 1, R temple x 1 (3) Actinic keratoses are precancerous spots that appear secondary to cumulative UV radiation exposure/sun exposure over time. They are chronic with expected duration over 1 year. A portion of actinic keratoses will progress to squamous cell carcinoma of the skin. It is not possible to reliably predict which spots will progress to skin cancer and so treatment is recommended to prevent development of skin cancer.  Recommend daily broad spectrum sunscreen SPF 30+ to sun-exposed areas, reapply every 2 hours as needed.  Recommend staying in the shade or wearing long sleeves, sun glasses (UVA+UVB protection) and wide brim hats (4-inch brim around the entire circumference of the hat). Call for new or changing lesions. Destruction of lesion - L nasal dorsum x 1, R frontal scalp x 1, R temple x 1 (3)  Destruction method: cryotherapy   Informed consent: discussed and consent obtained   Lesion destroyed using liquid nitrogen: Yes   Region frozen until ice ball extended beyond  lesion: Yes   Outcome: patient tolerated procedure well with no complications   Post-procedure details: wound care instructions given   Additional details:  Prior to procedure, discussed risks of blister formation, small wound, skin dyspigmentation, or rare scar following cryotherapy. Recommend Vaseline ointment to treated  areas while healing.  INFLAMED SEBORRHEIC KERATOSIS L forearm x 1 Symptomatic, irritating, patient would like treated. Destruction of lesion - L forearm x 1  Destruction method: cryotherapy   Informed consent: discussed and consent obtained   Lesion destroyed using liquid nitrogen: Yes   Region frozen until ice ball extended beyond lesion: Yes   Outcome: patient tolerated procedure well with no complications   Post-procedure details: wound care instructions given   Additional details:  Prior to procedure, discussed risks of blister formation, small wound, skin dyspigmentation, or rare scar following cryotherapy. Recommend Vaseline ointment to treated areas while healing.   Skin cancer screening performed today.  Actinic Damage - Chronic condition, secondary to cumulative UV/sun exposure - diffuse scaly erythematous macules with underlying dyspigmentation - Recommend daily broad spectrum sunscreen SPF 30+ to sun-exposed areas, reapply every 2 hours as needed.  - Staying in the shade or wearing long sleeves, sun glasses (UVA+UVB protection) and wide brim hats (4-inch brim around the entire circumference of the hat) are also recommended for sun protection.  - Call for new or changing lesions.  Lentigines, Seborrheic Keratoses, Hemangiomas - Benign normal skin lesions - SK L temporal scalp - Benign-appearing - Call for any changes   Melanocytic Nevi - Tan-brown and/or pink-flesh-colored symmetric macules and papules - Benign appearing on exam today - Observation - Call clinic for new or changing moles - Recommend daily use of broad spectrum spf 30+ sunscreen to sun-exposed areas.   HISTORY OF BASAL CELL CARCINOMA OF THE SKIN - No evidence of recurrence today- R upper back, L forearm - Recommend regular full body skin exams - Recommend daily broad spectrum sunscreen SPF 30+ to sun-exposed areas, reapply every 2 hours as needed.  - Call if any new or changing lesions are noted  between office visits   Acrochordons (Skin Tags) - Fleshy, skin-colored pedunculated papules - Benign appearing.  - Observe. - If desired, they can be removed with an in office procedure that is not covered by insurance. - Please call the clinic if you notice any new or changing lesions.   Sebaceous Hyperplasia - Small yellow papules with a central dell - Benign-appearing - Observe. Call for changes.   Return in about 1 year (around 09/21/2024) for UBSE, Hx of BCC, Hx of AKs.  I, Ardis Rowan, RMA, am acting as scribe for Willeen Niece, MD .   Documentation: I have reviewed the above documentation for accuracy and completeness, and I agree with the above.  Willeen Niece, MD

## 2023-09-22 NOTE — Patient Instructions (Addendum)

## 2024-07-13 ENCOUNTER — Other Ambulatory Visit: Payer: Self-pay

## 2024-07-13 ENCOUNTER — Inpatient Hospital Stay
Admission: RE | Admit: 2024-07-13 | Discharge: 2024-07-13 | Disposition: A | Payer: Self-pay | Source: Ambulatory Visit | Attending: Physician Assistant | Admitting: Physician Assistant

## 2024-07-13 ENCOUNTER — Telehealth: Payer: Self-pay | Admitting: Physician Assistant

## 2024-07-13 ENCOUNTER — Encounter: Payer: Self-pay | Admitting: Physician Assistant

## 2024-07-13 DIAGNOSIS — Z049 Encounter for examination and observation for unspecified reason: Secondary | ICD-10-CM

## 2024-07-13 NOTE — Telephone Encounter (Signed)
 CD uploaded but only had Xrays of Lumbar and hip/pelvis. No MRI

## 2024-07-13 NOTE — Telephone Encounter (Signed)
 Pt brought in disc, provided to jenna to upload into chart.

## 2024-07-13 NOTE — Telephone Encounter (Signed)
 Pt having lower back pain, hip & down to the leg. Sent over the req via epic to get progress notes. Pt has had a MRI & will be bringing the mri / report over from [facility].

## 2024-08-19 NOTE — Telephone Encounter (Signed)
 [facility] notes scanned into the chart.

## 2024-08-19 NOTE — Telephone Encounter (Signed)
 Ok to sch with any PA or Dr.Smith?

## 2024-08-22 NOTE — Telephone Encounter (Signed)
 Patient is scheduled for 09/07/2024.

## 2024-09-01 NOTE — Progress Notes (Addendum)
 "  Referring Physician:  Lenon Layman ORN, MD [address] [facility] [place],  [place] [address]  Primary Physician:  Lenon Layman ORN, MD  History of Present Illness: 09/07/2024 Mr. 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  He has had pain radiating to his right hip and primarily to his right thigh.  He has also had intermittent tingling that goes all the way down to his ankle and toes but is not as common.  His pain is so significant that it is difficult to sleep and do activities of daily living.  He feels a constant pain in his right buttock.  He underwent 2 rounds of Medrol  Dosepak as well as tramadol and gabapentin which helped minimally.  He feels as though his right knee keeps buckling and he has been having to use a walker.  No saddle anesthesia or incontinence.   Conservative measures:  Physical therapy: Has not participated in. Multimodal medical therapy including regular antiinflammatories: meloxicam , gabapentin, medrol  Dosepak, tramadol Injections: No epidural steroid injections.  Past Surgery: None  Michael Murillo has no symptoms of cervical myelopathy.  The symptoms are causing a significant impact on the patient's life.   Review of Systems:  A 10 point review of systems is negative, except for the pertinent positives and negatives detailed in the HPI.  Past Medical History: Past Medical History:  Diagnosis Date   Actinic keratosis    Basal cell carcinoma 07/23/2015   Right upper back. Nodular pattern. EDC   Basal cell carcinoma 07/23/2015   Left forearm. Nodular pattern. Excision: 08/20/2015   Sleep apnea     Past Surgical History: Past Surgical History:  Procedure Laterality Date   COLONOSCOPY WITH PROPOFOL  N/A 07/30/2020   Procedure: COLONOSCOPY WITH PROPOFOL ;  Surgeon: Toledo, Ladell POUR, MD;  Location: [facility] ENDOSCOPY;  Service:  Gastroenterology;  Laterality: N/A;    Allergies: Allergies as of 09/07/2024 - Review Complete 09/07/2024  Allergen Reaction Noted   Fexofenadine Rash and Dermatitis 11/19/2019    Medications: Outpatient Encounter Medications as of 09/07/2024  Medication Sig   atorvastatin (LIPITOR) 40 MG tablet TAKE ONE TABLET BY MOUTH DAILY   fluticasone (FLONASE) 50 MCG/ACT nasal spray SPRAY ONE SPRAY INTO BOTH NOSTILS TWO TIMES A DAY   losartan-hydrochlorothiazide (HYZAAR) 100-12.5 MG tablet Take 1 tablet by mouth daily.   metFORMIN (GLUCOPHAGE-XR) 500 MG 24 hr tablet Take 1,000 mg by mouth.   Multiple Vitamin (MULTIVITAMIN) tablet Take 1 tablet by mouth daily.   [DISCONTINUED] gabapentin (NEURONTIN) 300 MG capsule Take 1 capsule twice a day by oral route for 30 days.   [DISCONTINUED] ciclopirox  (LOPROX ) 0.77 % cream    [DISCONTINUED] ciclopirox  (LOPROX ) 0.77 % cream Apply topically 2 (two) times daily. Apply to feet and in between toes   [DISCONTINUED] meloxicam  (MOBIC ) 15 MG tablet TAKE ONE TABLET BY MOUTH DAILY   [DISCONTINUED] sildenafil (REVATIO) 20 MG tablet TAKE 2-3 TABLETS BY MOUTH DAILY AS NEEDED   No facility-administered encounter medications on file as of 09/07/2024.    Social History: Social History[1]  Family Medical History: No family history on file.  Physical Examination: @VITALWITHPAIN @  General: Patient is well developed, well nourished, calm, collected, and in no apparent distress. Attention to examination is appropriate.  Psychiatric: Patient is non-anxious.  Head:  Pupils equal, round, and reactive to light.  ENT:  Oral mucosa appears well hydrated.  Neck:  Supple.   Respiratory: Patient is breathing without any difficulty.  Extremities: No edema.  Vascular: Palpable dorsal pedal pulses.  Skin:   On exposed skin, there are no abnormal skin lesions.  NEUROLOGICAL:     Awake, alert, oriented to person, place, and time.  Speech is clear and fluent. Fund of  knowledge is appropriate.   Cranial Nerves: Pupils equal round and reactive to light.  Facial tone is symmetric.  Facial sensation is symmetric.  ROM of spine: Minimal tenderness palpation of lumbar paraspinals.  Strength:    Some resideual DF weaknes sin leftr foot after multipke LLE surgeroies  Left lower extremity, patient has some residual dorsiflexion weakness secondary to multiple left lower extremity surgeries.  In his right lower extremity, at least a 4+ throughout.  Some slight quad weakness as well as plantarflexion 94+.  Otherwise 2+ patellar reflexes bilaterally, trace Achilles reflexes bilaterally.  No ankle clonus.  Patient is walking with a walker secondary to significant pain.   Medical Decision Making  Imaging: EXAM: LUMBAR SPINE - COMPLETE 4+ VIEW   COMPARISON:  May 12, 2024   FINDINGS: Dextrocurvature of the thoracolumbar spine, centered at the L1 vertebral body. Five non-rib-bearing lumbar-type vertebral bodies are identified.   Bowel-gas pattern is nonobstructive. SI joints are symmetric. The sacrum is obscured by overlying bowel gas.   Trace stepwise retrolisthesis of L2 on L3 and L3 on L4. No evidence of dynamic instability on flexion/extension views.   Multilevel degenerative disc disease, greatest at L3-L4 and L5-S1. There is a prominent posterior disc osteophyte complex at L2-L3 and L3-L4. Mild lower lumbar facet arthropathy.   Osseous protuberance at the level of the RIGHT anterior inferior iliac spine. This is unchanged from October 2025 but is new compared to remote CT dated August 06, 2009.   IMPRESSION: 1. Osseous protuberance at the level of the RIGHT anterior inferior iliac spine, possibly due to prior traumatic injury (e.g. Rectus femoris avulsion), new since 2010 but stable from October 2025. Recommend correlation for history of prior injury in this area and dedicated RIGHT hip/pelvis radiographs for further evaluation. If there is  no explanatory history of traumatic injury, or if there is pain localizing to the RIGHT anterior inferior iliac spine, further evaluation with cross-sectional imaging (CT or MRI) should be considered.  I have personally reviewed the images and agree with the above interpretation.  Assessment and Plan: Michael Murillo is a pleasant 64 y.o. male unfortunately was pushing cars in October at his work and had some immediate pain in his low back and right hip.  He has had pain radiating to his right hip and primarily to his right thigh.  He has also had intermittent tingling that goes all the way down to his ankle and toes but is not as common.  His pain is so significant that it is difficult to sleep and do activities of daily living.  He feels a constant pain in his right buttock.  He underwent 2 rounds of Medrol  Dosepak as well as tramadol and gabapentin which helped minimally.  He feels as though his right knee keeps buckling and he has been having to use a walker.  On examination he does have some slight weakness.  He is using a walker to ambulate secondary to his pain and weakness.  X-ray today showed possible rectus femoris avulsion due to abnormality seen at right anterior inferior iliac spine.   Differential includes lumbar radiculopathy which I would favor at this point considering numbness  tingling extending down to ankle.  However patient does feel some knee instability and quite a bit of pain in his hip and thigh area which could be due to potential rectus femoris avulsion.  Would like to move forward with MRI of lumbar spine for evaluation.  In addition, would like patient to start physical therapy.  Would consider dedicated right hip/pelvis radiographs for further evaluation of potential rectus femoris avulsion.  Plan to see back in approximate 7 weeks.   Thank you for involving me in the care of this patient.   Lyle Decamp, PA-C Dept. of Neurosurgery     [1]  Social History Tobacco Use    Smoking status: Never   Smokeless tobacco: Never  Vaping Use   Vaping status: Never Used  Substance Use Topics   Alcohol use: Not Currently   Drug use: Never   "

## 2024-09-07 ENCOUNTER — Ambulatory Visit: Admitting: Physician Assistant

## 2024-09-07 ENCOUNTER — Ambulatory Visit: Payer: Self-pay | Admitting: Physician Assistant

## 2024-09-07 ENCOUNTER — Encounter: Payer: Self-pay | Admitting: Physician Assistant

## 2024-09-07 ENCOUNTER — Ambulatory Visit (INDEPENDENT_AMBULATORY_CARE_PROVIDER_SITE_OTHER)

## 2024-09-07 VITALS — BP 122/76 | Ht 66.0 in | Wt 183.0 lb

## 2024-09-07 DIAGNOSIS — M5416 Radiculopathy, lumbar region: Secondary | ICD-10-CM

## 2024-09-15 ENCOUNTER — Ambulatory Visit
Admission: RE | Admit: 2024-09-15 | Discharge: 2024-09-15 | Disposition: A | Source: Ambulatory Visit | Attending: Physician Assistant | Admitting: Physician Assistant

## 2024-09-15 DIAGNOSIS — M5416 Radiculopathy, lumbar region: Secondary | ICD-10-CM

## 2024-09-27 ENCOUNTER — Ambulatory Visit: Payer: BLUE CROSS/BLUE SHIELD | Admitting: Dermatology
